# Patient Record
Sex: Male | Born: 2000 | Race: Black or African American | Hispanic: No | Marital: Single | Smoking: Current every day smoker
Health system: Southern US, Community
[De-identification: ages and names within clinical notes are randomized; demographics above are authoritative.]

## PROBLEM LIST (undated history)

## (undated) DIAGNOSIS — R569 Unspecified convulsions: Secondary | ICD-10-CM

---
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
## 2021-03-10 ENCOUNTER — Emergency Department: Payer: BLUE CROSS/BLUE SHIELD

## 2021-03-10 ENCOUNTER — Other Ambulatory Visit: Payer: Self-pay

## 2021-03-10 ENCOUNTER — Encounter: Payer: Self-pay | Admitting: Emergency Medicine

## 2021-03-10 ENCOUNTER — Emergency Department
Admission: EM | Admit: 2021-03-10 | Discharge: 2021-03-10 | Disposition: A | Discharge status: Home / Self Care | Payer: BLUE CROSS/BLUE SHIELD | Attending: Emergency Medicine | Admitting: Emergency Medicine

## 2021-03-10 DIAGNOSIS — T1490XA Injury, unspecified, initial encounter: Secondary | ICD-10-CM

## 2021-03-10 DIAGNOSIS — F17210 Nicotine dependence, cigarettes, uncomplicated: Secondary | ICD-10-CM | POA: Insufficient documentation

## 2021-03-10 DIAGNOSIS — Z20822 Contact with and (suspected) exposure to covid-19: Secondary | ICD-10-CM | POA: Insufficient documentation

## 2021-03-10 DIAGNOSIS — R519 Headache, unspecified: Secondary | ICD-10-CM | POA: Insufficient documentation

## 2021-03-10 DIAGNOSIS — M25522 Pain in left elbow: Secondary | ICD-10-CM | POA: Insufficient documentation

## 2021-03-10 DIAGNOSIS — R072 Precordial pain: Secondary | ICD-10-CM | POA: Insufficient documentation

## 2021-03-10 DIAGNOSIS — Y900 Blood alcohol level of less than 20 mg/100 ml: Secondary | ICD-10-CM | POA: Diagnosis not present

## 2021-03-10 DIAGNOSIS — Z79899 Other long term (current) drug therapy: Secondary | ICD-10-CM | POA: Diagnosis not present

## 2021-03-10 DIAGNOSIS — R1012 Left upper quadrant pain: Secondary | ICD-10-CM | POA: Insufficient documentation

## 2021-03-10 DIAGNOSIS — Y92410 Unspecified street and highway as the place of occurrence of the external cause: Secondary | ICD-10-CM | POA: Insufficient documentation

## 2021-03-10 HISTORY — DX: Unspecified convulsions: R56.9

## 2021-03-10 LAB — COMPREHENSIVE METABOLIC PANEL
ALT: 16 U/L (ref 0–44)
AST: 26 U/L (ref 15–41)
Albumin: 4.1 g/dL (ref 3.5–5.0)
Alkaline Phosphatase: 56 U/L (ref 38–126)
Anion gap: 3 — ABNORMAL LOW (ref 5–15)
BUN: 11 mg/dL (ref 6–20)
CO2: 29 mmol/L (ref 22–32)
Calcium: 9.2 mg/dL (ref 8.9–10.3)
Chloride: 107 mmol/L (ref 98–111)
Creatinine, Ser: 1.03 mg/dL (ref 0.61–1.24)
GFR, Estimated: >60 mL/min (ref >60)
Glucose, Bld: 96 mg/dL (ref 70–99)
Potassium: 3.5 mmol/L (ref 3.5–5.1)
Sodium: 139 mmol/L (ref 135–145)
Total Bilirubin: 0.5 mg/dL (ref 0.3–1.2)
Total Protein: 7.4 g/dL (ref 6.5–8.1)

## 2021-03-10 LAB — CBC
HCT: 37.2 % — ABNORMAL LOW (ref 39.0–52.0)
Hemoglobin: 12.4 g/dL — ABNORMAL LOW (ref 13.0–17.0)
MCH: 29.5 pg (ref 26.0–34.0)
MCHC: 33.3 g/dL (ref 30.0–36.0)
MCV: 88.4 fL (ref 80.0–100.0)
Platelets: 164 10*3/uL (ref 150–400)
RBC: 4.21 MIL/uL — ABNORMAL LOW (ref 4.22–5.81)
RDW: 12.8 % (ref 11.5–15.5)
WBC: 6.3 10*3/uL (ref 4.0–10.5)
nRBC: 0 % (ref 0.0–0.2)

## 2021-03-10 LAB — RESP PANEL BY RT-PCR (FLU A&B, COVID) ARPGX2
Influenza A by PCR: NEGATIVE
Influenza B by PCR: NEGATIVE
SARS Coronavirus 2 by RT PCR: NEGATIVE

## 2021-03-10 LAB — ETHANOL: Alcohol, Ethyl (B): <10 mg/dL (ref <10)

## 2021-03-10 LAB — PROTIME-INR
INR: 1 (ref 0.8–1.2)
Prothrombin Time: 13.1 seconds (ref 11.4–15.2)

## 2021-03-10 LAB — SAMPLE TO BLOOD BANK

## 2021-03-10 MED ORDER — IOHEXOL 300 MG/ML  SOLN
100.0000 mL | Freq: Once | INTRAMUSCULAR | Status: AC | PRN
  Administered 2021-03-10: 100 mL via INTRAVENOUS

## 2021-03-10 MED ORDER — TRAMADOL HCL 50 MG PO TABS: 50.0000 mg | ORAL_TABLET | Freq: Four times a day (QID) | ORAL | 0 refills | Status: AC | PRN

## 2021-03-10 MED ORDER — FENTANYL CITRATE (PF) 100 MCG/2ML IJ SOLN
50.0000 ug | Freq: Once | INTRAMUSCULAR | Status: AC
  Administered 2021-03-10: 50 ug via INTRAVENOUS
  Filled 2021-03-10: qty 2

## 2021-03-10 MED ORDER — IBUPROFEN 600 MG PO TABS: 600.0000 mg | ORAL_TABLET | Freq: Four times a day (QID) | ORAL | 0 refills | Status: AC | PRN

## 2021-03-10 MED ORDER — SODIUM CHLORIDE 0.9 % IV BOLUS (SEPSIS)
1000.0000 mL | Freq: Once | INTRAVENOUS | Status: AC
  Administered 2021-03-10: 1000 mL via INTRAVENOUS

## 2021-03-10 MED ORDER — MORPHINE SULFATE (PF) 4 MG/ML IV SOLN
4.0000 mg | Freq: Once | INTRAVENOUS | Status: AC
  Administered 2021-03-10: 4 mg via INTRAVENOUS
  Filled 2021-03-10: qty 1

## 2021-03-10 MED ORDER — ONDANSETRON HCL 4 MG/2ML IJ SOLN
4.0000 mg | Freq: Once | INTRAMUSCULAR | Status: AC
  Administered 2021-03-10: 4 mg via INTRAVENOUS
  Filled 2021-03-10: qty 2
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
## 2021-03-10 NOTE — ED Notes (Signed)
Dr Elesa Massed to triage to assess pt for orders

## 2021-03-10 NOTE — ED Notes (Signed)
Waiting on ct

## 2021-03-10 NOTE — ED Provider Notes (Signed)
-----------------------------------------   9:03 AM on 03/10/2021 -----------------------------------------  I took over care on this patient from Dr. Elesa Massed.  The patient was pending an MRI to further evaluate irregularities in the T spine seen on CT.  MR shows no acute fracture or other acute traumatic findings, and the deformities seen on CT appear chronic.  There is a T2 lesion that requires 6 month followup.  On reassessment, the patient states he feels well and has minimal pain at this time.  I counseled him on the results of the imaging.  I advised him about the T2 lesion and the need for 6 month followup. I have also provided this information in the written discharge instructions.  I gave him thorough return precautions and he expressed understanding.  He is stable for discharge at this time.    Dionne Bucy, MD 03/10/21 [phone]
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
## 2021-03-10 NOTE — ED Notes (Signed)
Took over patient care. complaiing of left side pain
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
